# Patient Record
Sex: Female | Born: 1970 | Race: Asian | Hispanic: No | Marital: Married | Smoking: Never smoker
Health system: Southern US, Community
[De-identification: ages and names within clinical notes are randomized; demographics above are authoritative.]

---

## 2008-11-20 ENCOUNTER — Inpatient Hospital Stay (HOSPITAL_COMMUNITY): Admission: AD | Admit: 2008-11-20 | Discharge: 2008-11-20 | Payer: Self-pay | Admitting: Obstetrics & Gynecology

## 2008-11-22 ENCOUNTER — Inpatient Hospital Stay (HOSPITAL_COMMUNITY): Admission: AD | Admit: 2008-11-22 | Discharge: 2008-11-23 | Payer: Self-pay | Admitting: Obstetrics & Gynecology

## 2009-08-14 ENCOUNTER — Inpatient Hospital Stay (HOSPITAL_COMMUNITY): Admission: AD | Admit: 2009-08-14 | Discharge: 2009-08-14 | Payer: Self-pay | Admitting: Obstetrics & Gynecology

## 2009-08-14 ENCOUNTER — Emergency Department (HOSPITAL_BASED_OUTPATIENT_CLINIC_OR_DEPARTMENT_OTHER): Admission: EM | Admit: 2009-08-14 | Discharge: 2009-08-14 | Payer: Self-pay | Admitting: Emergency Medicine

## 2009-08-20 ENCOUNTER — Ambulatory Visit (HOSPITAL_COMMUNITY): Admission: RE | Admit: 2009-08-20 | Discharge: 2009-08-20 | Payer: Self-pay | Admitting: Obstetrics & Gynecology

## 2009-10-09 ENCOUNTER — Inpatient Hospital Stay (HOSPITAL_COMMUNITY): Admission: AD | Admit: 2009-10-09 | Discharge: 2009-10-09 | Payer: Self-pay | Admitting: Obstetrics

## 2009-10-14 ENCOUNTER — Inpatient Hospital Stay (HOSPITAL_COMMUNITY): Admission: AD | Admit: 2009-10-14 | Discharge: 2009-10-16 | Payer: Self-pay | Admitting: Obstetrics & Gynecology

## 2009-10-15 ENCOUNTER — Encounter: Payer: Self-pay | Admitting: Obstetrics & Gynecology

## 2009-11-12 ENCOUNTER — Ambulatory Visit (HOSPITAL_COMMUNITY): Admission: RE | Admit: 2009-11-12 | Discharge: 2009-11-12 | Payer: Self-pay | Admitting: Obstetrics

## 2009-12-02 ENCOUNTER — Ambulatory Visit (HOSPITAL_COMMUNITY): Admission: RE | Admit: 2009-12-02 | Discharge: 2009-12-02 | Payer: Self-pay | Admitting: Obstetrics & Gynecology

## 2010-06-10 ENCOUNTER — Ambulatory Visit (HOSPITAL_COMMUNITY): Admission: RE | Admit: 2010-06-10 | Discharge: 2010-06-10 | Payer: Self-pay | Admitting: Obstetrics and Gynecology

## 2010-06-15 ENCOUNTER — Ambulatory Visit (HOSPITAL_COMMUNITY): Admission: RE | Admit: 2010-06-15 | Discharge: 2010-06-15 | Payer: Self-pay | Admitting: Obstetrics and Gynecology

## 2010-12-05 ENCOUNTER — Encounter: Payer: Self-pay | Admitting: Obstetrics & Gynecology

## 2010-12-05 ENCOUNTER — Encounter: Payer: Self-pay | Admitting: Obstetrics

## 2010-12-06 ENCOUNTER — Encounter: Payer: Self-pay | Admitting: Obstetrics & Gynecology

## 2011-01-18 ENCOUNTER — Other Ambulatory Visit: Payer: Self-pay | Admitting: General Surgery

## 2011-01-18 DIAGNOSIS — N644 Mastodynia: Secondary | ICD-10-CM

## 2011-01-20 ENCOUNTER — Other Ambulatory Visit: Payer: Self-pay

## 2011-01-28 LAB — CBC
HCT: 45.1 % (ref 36.0–46.0)
Hemoglobin: 15.5 g/dL — ABNORMAL HIGH (ref 12.0–15.0)
MCH: 32.5 pg (ref 26.0–34.0)
MCHC: 34.3 g/dL (ref 30.0–36.0)
MCV: 94.8 fL (ref 78.0–100.0)
Platelets: 249 10*3/uL (ref 150–400)
RBC: 4.76 MIL/uL (ref 3.87–5.11)
RDW: 13.2 % (ref 11.5–15.5)
WBC: 6 10*3/uL (ref 4.0–10.5)

## 2011-01-28 LAB — PROTEIN C, TOTAL: Protein C, Total: 95 % (ref 70–140)

## 2011-01-28 LAB — HEPATIC FUNCTION PANEL
ALT: 22 U/L (ref 0–35)
AST: 19 U/L (ref 0–37)
Albumin: 4.2 g/dL (ref 3.5–5.2)
Alkaline Phosphatase: 46 U/L (ref 39–117)
Bilirubin, Direct: 0.2 mg/dL (ref 0.0–0.3)
Indirect Bilirubin: 0.5 mg/dL (ref 0.3–0.9)
Total Bilirubin: 0.7 mg/dL (ref 0.3–1.2)
Total Protein: 7.4 g/dL (ref 6.0–8.3)

## 2011-01-28 LAB — LUPUS ANTICOAGULANT PANEL
DRVVT: 37.9 secs (ref 36.2–44.3)
Lupus Anticoagulant: NOT DETECTED
PTT Lupus Anticoagulant: 36.2 secs (ref 30.0–45.6)

## 2011-01-28 LAB — PROTEIN C ACTIVITY: Protein C Activity: 112 % (ref 75–133)

## 2011-01-28 LAB — BETA-2-GLYCOPROTEIN I ABS, IGG/M/A
Beta-2 Glyco I IgG: 7 G Units (ref <20)
Beta-2-Glycoprotein I IgA: 3 A Units (ref <20)
Beta-2-Glycoprotein I IgM: 1 M Units (ref <20)

## 2011-01-28 LAB — PROTEIN S, TOTAL: Protein S Ag, Total: 90 % (ref 70–140)

## 2011-01-28 LAB — HOMOCYSTEINE: Homocysteine: 5.2 umol/L (ref 4.0–15.4)

## 2011-01-28 LAB — PROTHROMBIN GENE MUTATION

## 2011-01-28 LAB — CARDIOLIPIN ANTIBODIES, IGG, IGM, IGA
Anticardiolipin IgA: 1 APL U/mL — ABNORMAL LOW (ref <22)
Anticardiolipin IgG: 7 GPL U/mL — ABNORMAL LOW (ref <23)
Anticardiolipin IgM: 2 MPL U/mL — ABNORMAL LOW (ref <11)

## 2011-01-28 LAB — PROTEIN S ACTIVITY: Protein S Activity: 58 % — ABNORMAL LOW (ref 69–129)

## 2011-01-28 LAB — FACTOR 5 LEIDEN

## 2011-01-28 LAB — ANTITHROMBIN III: AntiThromb III Func: 112 % (ref 76–126)

## 2011-01-28 IMAGING — US US OB TRANSVAGINAL MODIFY
1 series · 14 of 28 positions shown · non-contrast
Comparison: none

OBSTETRICAL ULTRASOUND:
 This ultrasound exam was performed in the [HOSPITAL] Ultrasound Department.  The OB US report was generated in the AS system, and faxed to the ordering physician.  This report is also available in [HOSPITAL]?s Access[employer] and in [REDACTED] PACS.

[Series 1: us ob comp less 14 wks · 0.22mm/px · 56 acquisitions, 14 frames shown]
[im 3/56]
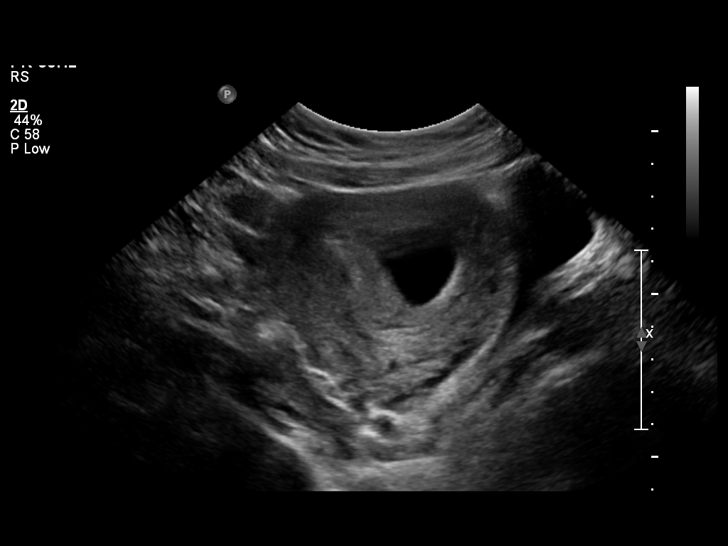
[im 7/56]
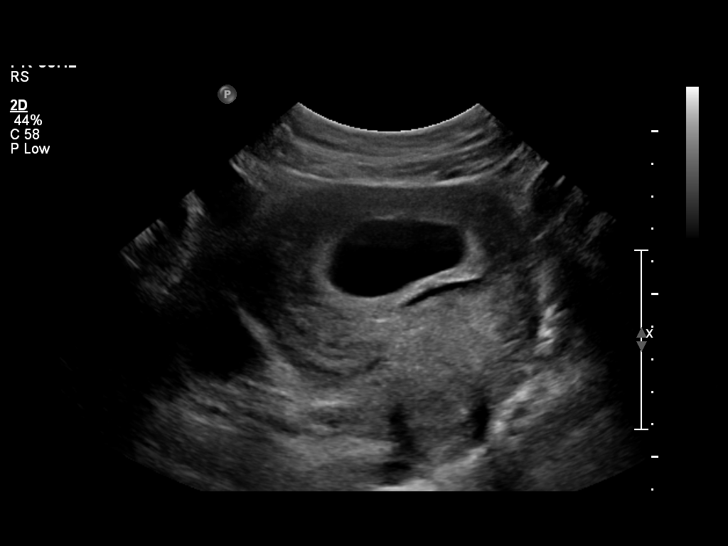
[im 11/56]
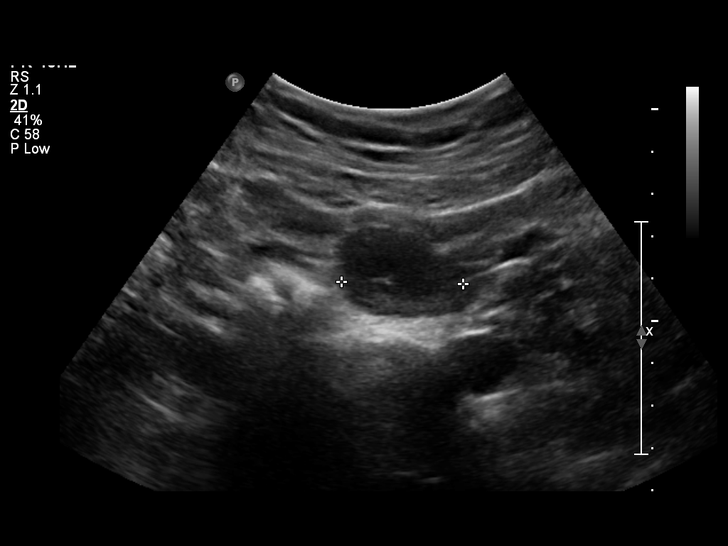
[im 15/56]
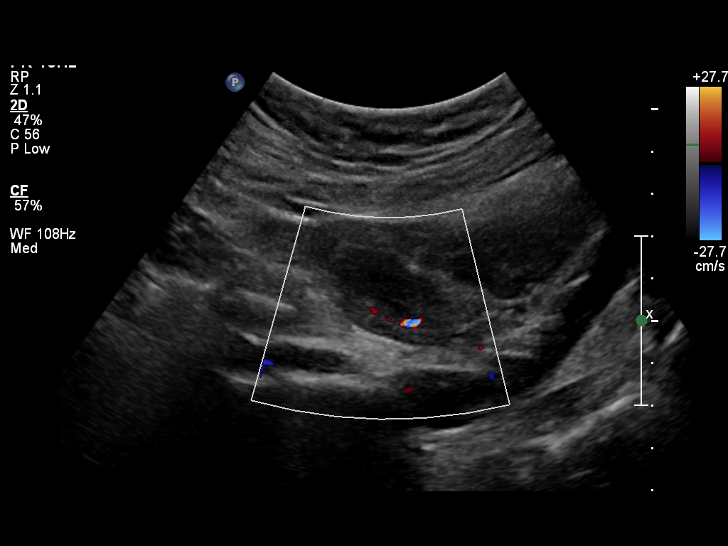
[im 19/56]
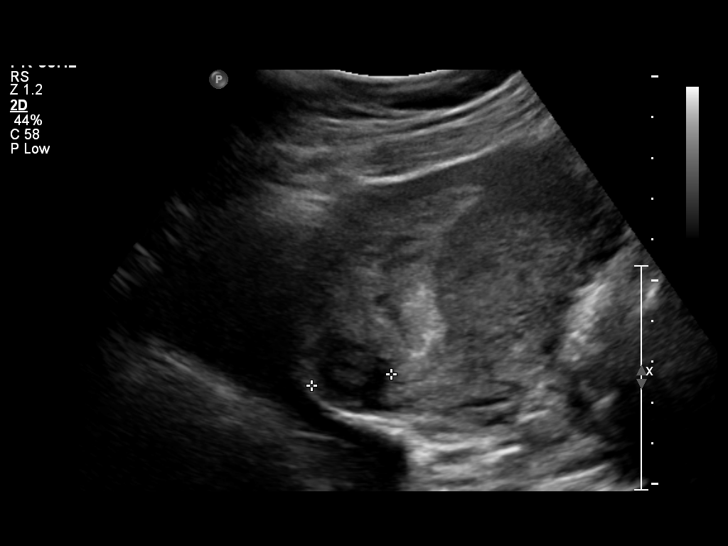
[im 23/56]
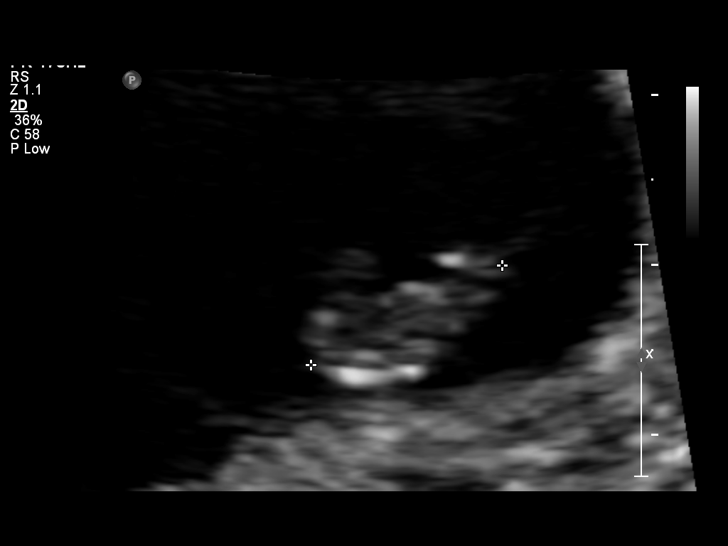
[im 27/56]
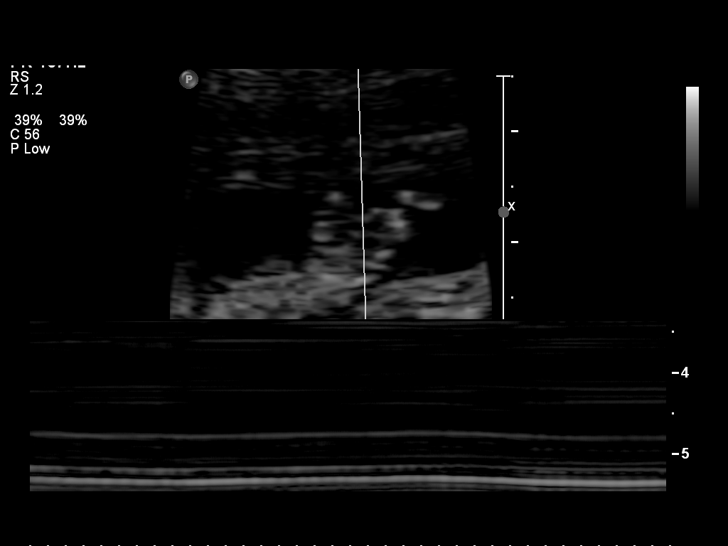
[im 31/56]
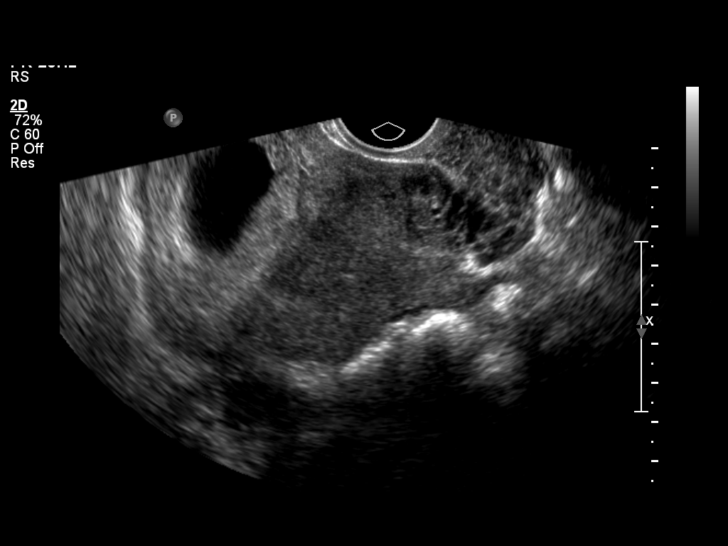
[im 35/56]
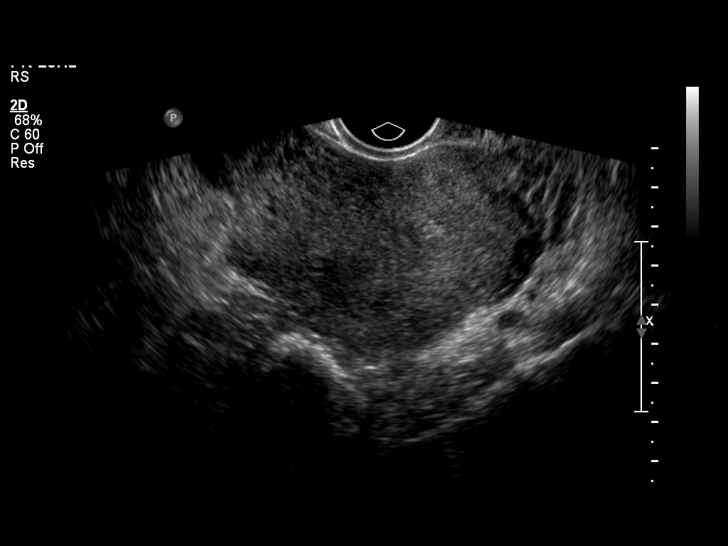
[im 39/56]
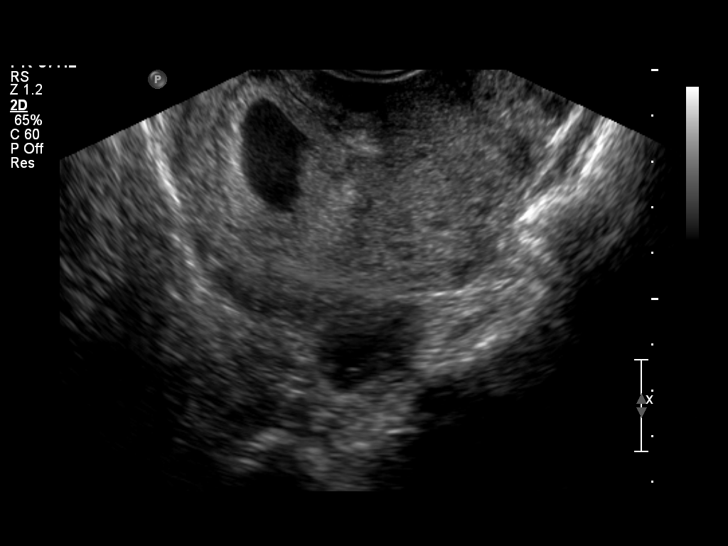
[im 43/56]
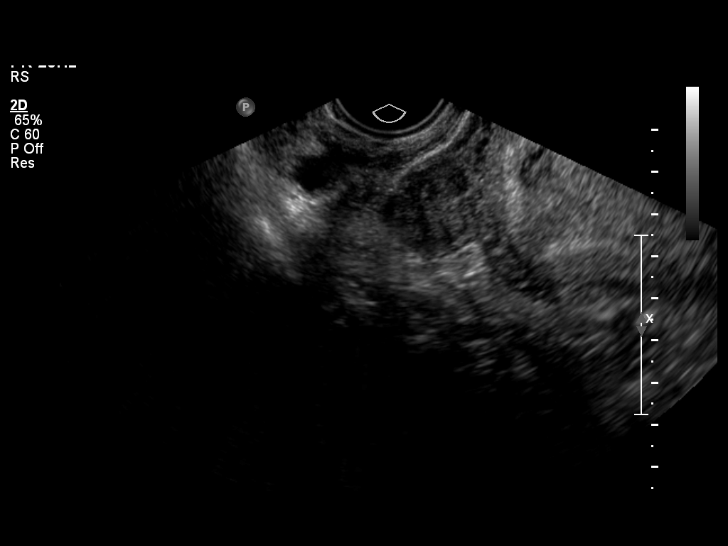
[im 47/56]
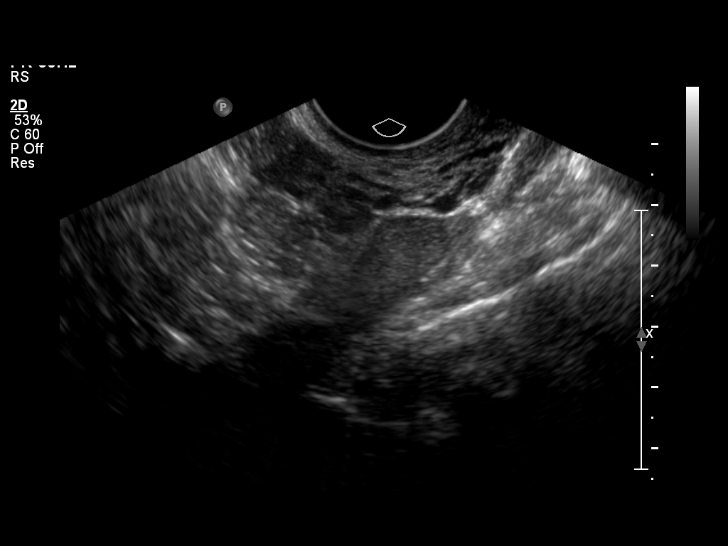
[im 51/56]
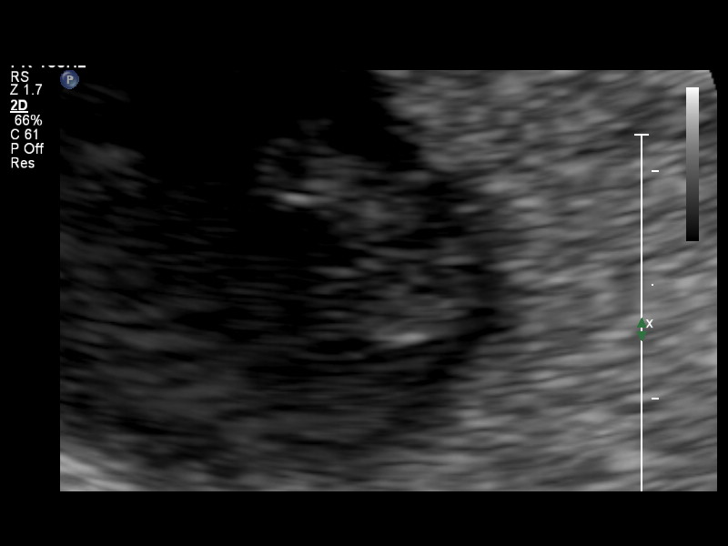
[im 56/56]
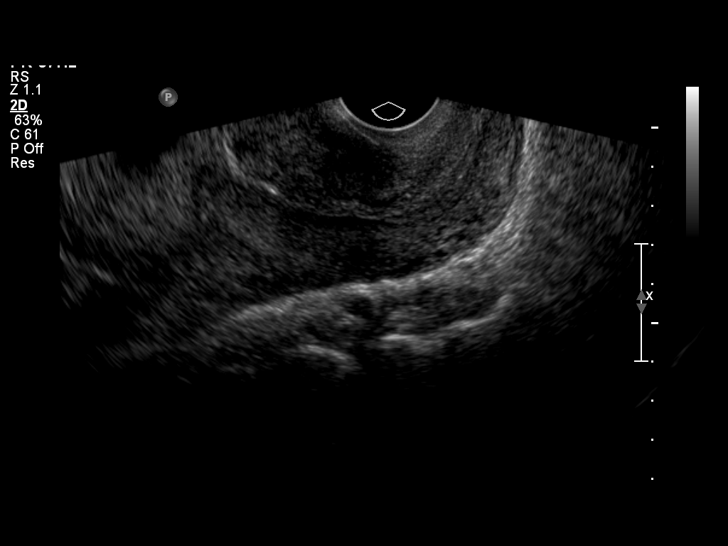

[14 of 28 positions shown; findings below may reference images not displayed]

IMPRESSION: See AS Obstetric US report.

## 2011-02-15 LAB — CBC
HCT: 36.3 % (ref 36.0–46.0)
HCT: 42.9 % (ref 36.0–46.0)
Hemoglobin: 12.5 g/dL (ref 12.0–15.0)
Hemoglobin: 14.6 g/dL (ref 12.0–15.0)
MCHC: 34.1 g/dL (ref 30.0–36.0)
MCHC: 34.5 g/dL (ref 30.0–36.0)
MCV: 98.9 fL (ref 78.0–100.0)
MCV: 99.6 fL (ref 78.0–100.0)
Platelets: 197 10*3/uL (ref 150–400)
Platelets: 221 10*3/uL (ref 150–400)
RBC: 3.67 MIL/uL — ABNORMAL LOW (ref 3.87–5.11)
RBC: 4.31 MIL/uL (ref 3.87–5.11)
RDW: 12.6 % (ref 11.5–15.5)
RDW: 12.8 % (ref 11.5–15.5)
WBC: 11 10*3/uL — ABNORMAL HIGH (ref 4.0–10.5)
WBC: 7.7 10*3/uL (ref 4.0–10.5)

## 2011-02-15 LAB — PARVOVIRUS B19 ANTIBODY, IGG AND IGM
Parovirus B19 IgG Abs: 1.9 {index} — ABNORMAL HIGH
Parovirus B19 IgM Abs: <0.9 {index}

## 2011-02-15 LAB — KLEIHAUER-BETKE STAIN
Fetal Cells %: 0 %
Quantitation Fetal Hemoglobin: 0 mL

## 2011-02-15 LAB — LUPUS ANTICOAGULANT PANEL
DRVVT: 39.2 secs (ref 34.7–40.5)
Lupus Anticoagulant: NOT DETECTED
PTT Lupus Anticoagulant: 35 secs (ref 32.0–43.4)

## 2011-02-15 LAB — CARDIOLIPIN ANTIBODIES, IGM+IGG
Anticardiolipin IgG: <3 GPL U/mL — ABNORMAL LOW
Anticardiolipin IgM: <3 [MPL'U]/mL — ABNORMAL LOW

## 2011-02-15 LAB — RPR: RPR Ser Ql: NONREACTIVE

## 2011-02-15 LAB — TSH: TSH: 0.514 u[IU]/mL (ref 0.350–4.500)

## 2011-02-28 LAB — GC/CHLAMYDIA PROBE AMP, GENITAL
Chlamydia, DNA Probe: NEGATIVE
GC Probe Amp, Genital: NEGATIVE

## 2011-02-28 LAB — URINE CULTURE
Colony Count: NO GROWTH
Culture: NO GROWTH

## 2011-02-28 LAB — URINALYSIS, ROUTINE W REFLEX MICROSCOPIC
Bilirubin Urine: NEGATIVE
Glucose, UA: NEGATIVE mg/dL
Ketones, ur: NEGATIVE mg/dL
Nitrite: NEGATIVE
Protein, ur: NEGATIVE mg/dL
Specific Gravity, Urine: <1.005 — ABNORMAL LOW (ref 1.005–1.030)
Urobilinogen, UA: 0.2 mg/dL (ref 0.0–1.0)
pH: 7 (ref 5.0–8.0)

## 2011-02-28 LAB — HCG, QUANTITATIVE, PREGNANCY: hCG, Beta Chain, Quant, S: 9819 m[IU]/mL — ABNORMAL HIGH (ref <5)

## 2011-02-28 LAB — URINE MICROSCOPIC-ADD ON

## 2011-02-28 LAB — CBC
HCT: 39.7 % (ref 36.0–46.0)
Hemoglobin: 13.4 g/dL (ref 12.0–15.0)
MCHC: 33.7 g/dL (ref 30.0–36.0)
MCV: 95.4 fL (ref 78.0–100.0)
Platelets: 223 10*3/uL (ref 150–400)
RBC: 4.16 MIL/uL (ref 3.87–5.11)
RDW: 12.5 % (ref 11.5–15.5)
WBC: 6 10*3/uL (ref 4.0–10.5)

## 2011-02-28 LAB — WET PREP, GENITAL
Clue Cells Wet Prep HPF POC: NONE SEEN
Trich, Wet Prep: NONE SEEN

## 2011-02-28 LAB — POCT PREGNANCY, URINE: Preg Test, Ur: POSITIVE

## 2011-02-28 LAB — ABO/RH: ABO/RH(D): B POS

## 2012-07-16 ENCOUNTER — Emergency Department (HOSPITAL_COMMUNITY)
Admission: EM | Admit: 2012-07-16 | Discharge: 2012-07-17 | Disposition: A | Discharge status: Home / Self Care | Payer: PRIVATE HEALTH INSURANCE | Attending: Emergency Medicine | Admitting: Emergency Medicine

## 2012-07-16 ENCOUNTER — Emergency Department (HOSPITAL_COMMUNITY): Payer: PRIVATE HEALTH INSURANCE

## 2012-07-16 ENCOUNTER — Encounter (HOSPITAL_COMMUNITY): Payer: Self-pay | Admitting: Family Medicine

## 2012-07-16 DIAGNOSIS — R112 Nausea with vomiting, unspecified: Secondary | ICD-10-CM | POA: Insufficient documentation

## 2012-07-16 DIAGNOSIS — R197 Diarrhea, unspecified: Secondary | ICD-10-CM | POA: Insufficient documentation

## 2012-07-16 DIAGNOSIS — R1031 Right lower quadrant pain: Secondary | ICD-10-CM | POA: Insufficient documentation

## 2012-07-16 DIAGNOSIS — R509 Fever, unspecified: Secondary | ICD-10-CM | POA: Insufficient documentation

## 2012-07-16 DIAGNOSIS — R109 Unspecified abdominal pain: Secondary | ICD-10-CM

## 2012-07-16 DIAGNOSIS — R42 Dizziness and giddiness: Secondary | ICD-10-CM | POA: Insufficient documentation

## 2012-07-16 LAB — CBC WITH DIFFERENTIAL/PLATELET
Basophils Absolute: 0 10*3/uL (ref 0.0–0.1)
Basophils Relative: 0 % (ref 0–1)
Eosinophils Absolute: 0.1 10*3/uL (ref 0.0–0.7)
Eosinophils Relative: 2 % (ref 0–5)
HCT: 40.3 % (ref 36.0–46.0)
Hemoglobin: 14.1 g/dL (ref 12.0–15.0)
Lymphocytes Relative: 23 % (ref 12–46)
Lymphs Abs: 1.7 10*3/uL (ref 0.7–4.0)
MCH: 31.6 pg (ref 26.0–34.0)
MCHC: 35 g/dL (ref 30.0–36.0)
MCV: 90.4 fL (ref 78.0–100.0)
Monocytes Absolute: 0.6 10*3/uL (ref 0.1–1.0)
Monocytes Relative: 9 % (ref 3–12)
Neutro Abs: 4.7 10*3/uL (ref 1.7–7.7)
Neutrophils Relative %: 66 % (ref 43–77)
Platelets: 214 10*3/uL (ref 150–400)
RBC: 4.46 MIL/uL (ref 3.87–5.11)
RDW: 12.3 % (ref 11.5–15.5)
WBC: 7.1 10*3/uL (ref 4.0–10.5)

## 2012-07-16 LAB — URINALYSIS, ROUTINE W REFLEX MICROSCOPIC
Bilirubin Urine: NEGATIVE
Glucose, UA: NEGATIVE mg/dL
Hgb urine dipstick: NEGATIVE
Ketones, ur: NEGATIVE mg/dL
Leukocytes, UA: NEGATIVE
Nitrite: NEGATIVE
Protein, ur: NEGATIVE mg/dL
Specific Gravity, Urine: 1.012 (ref 1.005–1.030)
Urobilinogen, UA: 0.2 mg/dL (ref 0.0–1.0)
pH: 8 (ref 5.0–8.0)

## 2012-07-16 LAB — COMPREHENSIVE METABOLIC PANEL
ALT: 20 U/L (ref 0–35)
AST: 18 U/L (ref 0–37)
Albumin: 3.8 g/dL (ref 3.5–5.2)
Alkaline Phosphatase: 57 U/L (ref 39–117)
BUN: 6 mg/dL (ref 6–23)
CO2: 28 mEq/L (ref 19–32)
Calcium: 9.3 mg/dL (ref 8.4–10.5)
Chloride: 103 mEq/L (ref 96–112)
Creatinine, Ser: 0.63 mg/dL (ref 0.50–1.10)
GFR calc Af Amer: >90 mL/min (ref >90)
GFR calc non Af Amer: >90 mL/min (ref >90)
Glucose, Bld: 114 mg/dL — ABNORMAL HIGH (ref 70–99)
Potassium: 4 mEq/L (ref 3.5–5.1)
Sodium: 139 mEq/L (ref 135–145)
Total Bilirubin: 0.3 mg/dL (ref 0.3–1.2)
Total Protein: 6.8 g/dL (ref 6.0–8.3)

## 2012-07-16 LAB — LIPASE, BLOOD: Lipase: 28 U/L (ref 11–59)

## 2012-07-16 LAB — POCT PREGNANCY, URINE: Preg Test, Ur: NEGATIVE

## 2012-07-16 MED ORDER — ONDANSETRON HCL 4 MG/2ML IJ SOLN
4.0000 mg | Freq: Once | INTRAMUSCULAR | Status: AC
  Administered 2012-07-16: 4 mg via INTRAVENOUS
  Filled 2012-07-16: qty 2

## 2012-07-16 MED ORDER — SODIUM CHLORIDE 0.9 % IV SOLN: 1000.0000 mL | Freq: Once | INTRAVENOUS | Status: DC

## 2012-07-16 MED ORDER — MORPHINE SULFATE 4 MG/ML IJ SOLN
6.0000 mg | Freq: Once | INTRAMUSCULAR | Status: AC
  Administered 2012-07-16: 6 mg via INTRAVENOUS
  Filled 2012-07-16: qty 2

## 2012-07-16 MED ORDER — SODIUM CHLORIDE 0.9 % IV SOLN: 1000.0000 mL | INTRAVENOUS | Status: DC

## 2012-07-16 MED ORDER — SODIUM CHLORIDE 0.9 % IV SOLN
1000.0000 mL | Freq: Once | INTRAVENOUS | Status: AC
  Administered 2012-07-16: 1000 mL via INTRAVENOUS

## 2012-07-16 NOTE — ED Notes (Addendum)
Pt states that when she walks she starts [place] feel nauseas and dizziness. Pt states that she has been feeling this way for 3 days. Pt also c/o pain on her lower right side of her abdomen.

## 2012-07-16 NOTE — ED Notes (Signed)
Pt continues Claudia Beltran c/o pain.  Notified that there is 1 person in front of her Castillo go back. VS stable.

## 2012-07-16 NOTE — ED Notes (Signed)
pt has had right sided abdominal pain, N,V for 3 days. Pt also dizzy .

## 2012-07-16 NOTE — ED Notes (Signed)
Patient transported [facility] 

## 2012-07-17 ENCOUNTER — Emergency Department (HOSPITAL_COMMUNITY): Payer: PRIVATE HEALTH INSURANCE

## 2012-07-17 ENCOUNTER — Encounter (HOSPITAL_COMMUNITY): Payer: Self-pay | Admitting: Radiology

## 2012-07-17 MED ORDER — IOHEXOL 300 MG/ML  SOLN: 20.0000 mL | INTRAMUSCULAR | Status: AC

## 2012-07-17 MED ORDER — OXYCODONE-ACETAMINOPHEN 5-325 MG PO TABS: 1.0000 | ORAL_TABLET | Freq: Four times a day (QID) | ORAL | Status: AC | PRN

## 2012-07-17 MED ORDER — SODIUM CHLORIDE 0.9 % IV SOLN
Freq: Once | INTRAVENOUS | Status: AC
  Administered 2012-07-17: 01:00:00 via INTRAVENOUS

## 2012-07-17 MED ORDER — IOHEXOL 300 MG/ML  SOLN
100.0000 mL | Freq: Once | INTRAMUSCULAR | Status: AC | PRN
  Administered 2012-07-17: 100 mL via INTRAVENOUS

## 2012-07-17 NOTE — ED Notes (Signed)
Used interpreter Spragg explain pt D/C information/ pt placed in waiting area per pt husband will pick her up when he gets off wk at 5:30a.m. Reported [facility] Nurse First

## 2012-07-17 NOTE — ED Provider Notes (Signed)
History     CSN: [id number]  Arrival date & time 07/16/12  1433   First MD Initiated Contact with Patient 07/16/12 2057      Chief Complaint  Patient presents with  . Abdominal Pain    (Consider location/radiation/quality/duration/timing/severity/associated sxs/prior treatment) The history is provided by the patient.   the patient reports right-sided abdominal pain and nausea vomiting for 3 days.  Patient is also had some lightheadedness.  She denies melena or hematochezia.  She's had no hematemesis.  She reports the pain is worsened when she is.  The pain has been somewhat intermittent although seems Achenbach be worsening at this time.  She points Guo the right side of her abdomen when describing the pain.  She has no flank pain.  She denies dysuria or urinary frequency.  She's had no fevers or chills.  She's never had this type of abdominal pain before.  Pain is mild Dexheimer moderate in severity.  History reviewed. No pertinent past medical history.  History reviewed. No pertinent past surgical history.  History reviewed. No pertinent family history.  History  Substance Use Topics  . Smoking status: Never Smoker   . Smokeless tobacco: Not on file  . Alcohol Use: No    OB History    Grav Para Term Preterm Abortions TAB SAB Ect Mult Living                  Review of Systems  All other systems reviewed and are negative.    Allergies  Review of patient's allergies indicates no known allergies.  Home Medications   Current Outpatient Rx  Name Route Sig Dispense Refill  . ADULT MULTIVITAMIN W/MINERALS CH Oral Take 1 tablet by mouth daily.    Marland Kitchen OVER THE COUNTER MEDICATION Oral Take 1 tablet by mouth 2 (two) times daily as needed. Over the counter medication taken twice a day as needed for pain      BP 118/72  Pulse 60  Temp 97.9 F (36.6 C) (Oral)  Resp 17  SpO2 99%  LMP 07/10/2012  Physical Exam  Nursing note and vitals reviewed. Constitutional: She is oriented [place] person,  place, and time. She appears well-developed and well-nourished. No distress.  HENT:  Head: Normocephalic and atraumatic.  Eyes: EOM are normal.  Neck: Normal range of motion.  Cardiovascular: Normal rate, regular rhythm and normal heart sounds.   Pulmonary/Chest: Effort normal and breath sounds normal.  Abdominal: Soft. She exhibits no distension.       Mild right-sided abdominal tenderness without guarding or rebound  Genitourinary:       No CVA tenderness  Musculoskeletal: Normal range of motion.  Neurological: She is alert and oriented [place] person, place, and time.  Skin: Skin is warm and dry.  Psychiatric: She has a normal mood and affect. Judgment normal.    ED Course  Procedures (including critical care time)  Labs Reviewed  COMPREHENSIVE METABOLIC PANEL - Abnormal; Notable for the following:    Glucose, Bld 114 (*)     All other components within normal limits  URINALYSIS, ROUTINE W REFLEX MICROSCOPIC - Abnormal; Notable for the following:    APPearance CLOUDY (*)     All other components within normal limits  CBC WITH DIFFERENTIAL  POCT PREGNANCY, URINE  LIPASE, BLOOD   US Abdomen Complete  07/16/2012  *RADIOLOGY REPORT*  Clinical Data:  Abdominal pain, nausea, fever, diarrhea  ULTRASOUND ABDOMEN:  Technique:  Sonography of upper abdominal structures was performed.  Comparison:  None  Gallbladder:  Normally distended without stones or wall thickening. No pericholecystic fluid or sonographic Murphy sign.  Common bile duct:  Normal caliber 4 mm diameter  Liver:  Normal appearance  IVC:  Normal appearance  Pancreas:  Upper normal pancreatic duct size 2.9 mm.  Otherwise normal appearance.  Spleen:  Normal appearance, 5.6 cm length  Right kidney:  9.6 cm length.  No mass or hydronephrosis.  Left kidney:  9.3 cm length. Normal morphology without mass or hydronephrosis.  Aorta:  Normal caliber  Other:  No free fluid  IMPRESSION: No acute abnormalities.   Original Report Authenticated  By: Lollie Marrow, M.D.     I personally reviewed the imaging tests through PACS system  I reviewed available ER/hospitalization records thought the EMR   No diagnosis found.    MDM  The patient had symptoms are concerning for cholelithiasis per ultrasound demonstrates no evidence of gallstones.  On repeat abdominal exam the patient is continuing Geraghty have right-sided abdominal tenderness I do not have a definitive cause for this given her ongoing pain I think important that we evaluate her she does not have a primary care physician.  She will have a CT abdomen pelvis performed Baiz evaluate further for the cause of her right-sided abdominal pain.        Lyanne Co, MD 07/17/12 [phone]

## 2012-07-17 NOTE — ED Notes (Signed)
Pt is back in room from radiology 

## 2012-07-17 NOTE — ED Notes (Signed)
Patient transported [place] [facility] 

## 2012-11-16 ENCOUNTER — Emergency Department (HOSPITAL_BASED_OUTPATIENT_CLINIC_OR_DEPARTMENT_OTHER): Payer: PRIVATE HEALTH INSURANCE

## 2012-11-16 ENCOUNTER — Emergency Department (HOSPITAL_BASED_OUTPATIENT_CLINIC_OR_DEPARTMENT_OTHER)
Admission: EM | Admit: 2012-11-16 | Discharge: 2012-11-16 | Disposition: A | Discharge status: Home / Self Care | Payer: PRIVATE HEALTH INSURANCE | Attending: Emergency Medicine | Admitting: Emergency Medicine

## 2012-11-16 ENCOUNTER — Other Ambulatory Visit: Payer: Self-pay | Admitting: Obstetrics and Gynecology

## 2012-11-16 ENCOUNTER — Encounter (HOSPITAL_BASED_OUTPATIENT_CLINIC_OR_DEPARTMENT_OTHER): Payer: Self-pay | Admitting: *Deleted

## 2012-11-16 DIAGNOSIS — R059 Cough, unspecified: Secondary | ICD-10-CM

## 2012-11-16 DIAGNOSIS — Z79899 Other long term (current) drug therapy: Secondary | ICD-10-CM | POA: Insufficient documentation

## 2012-11-16 DIAGNOSIS — R05 Cough: Secondary | ICD-10-CM

## 2012-11-16 DIAGNOSIS — N644 Mastodynia: Secondary | ICD-10-CM

## 2012-11-16 DIAGNOSIS — R0609 Other forms of dyspnea: Secondary | ICD-10-CM | POA: Insufficient documentation

## 2012-11-16 DIAGNOSIS — R0600 Dyspnea, unspecified: Secondary | ICD-10-CM

## 2012-11-16 DIAGNOSIS — R0989 Other specified symptoms and signs involving the circulatory and respiratory systems: Secondary | ICD-10-CM | POA: Insufficient documentation

## 2012-11-16 NOTE — ED Provider Notes (Addendum)
History     CSN: [id number]  Arrival date & time 11/16/12  0716   First MD Initiated Contact with Patient 11/16/12 [phone]      Chief Complaint  Patient presents with  . Cough    (Consider location/radiation/quality/duration/timing/severity/associated sxs/prior treatment) HPI Comments: Patient presents after an episode of difficulty breathing, "gasping for air" that started shortly after arriving at work.  She was fine last night and in the morning.  She then went into the bathroom and coughed up a little bit of blood.  She denies chest pain or shortness of breath.  There is no fever.  She feels fine now.  She does not speak much [place].  History was taken through boyfriend who acts as Nurse, learning disability and co-worker who was present at bedside who witnessed the episode.  He says this looked like a panic attack as his girlfriend has these and has seen them before.  Patient is a 42 y.o. female presenting with cough. The history is provided by the patient.  Cough This is a new problem. The current episode started less than 1 hour ago. The problem occurs constantly. The problem has been resolved. The cough is productive of bloody sputum. There has been no fever. Pertinent negatives include no chest pain. She has tried nothing for the symptoms.    History reviewed. No pertinent past medical history.  History reviewed. No pertinent past surgical history.  History reviewed. No pertinent family history.  History  Substance Use Topics  . Smoking status: Never Smoker   . Smokeless tobacco: Not on file  . Alcohol Use: No    OB History    Grav Para Term Preterm Abortions TAB SAB Ect Mult Living                  Review of Systems  Respiratory: Positive for cough.   Cardiovascular: Negative for chest pain.  All other systems reviewed and are negative.    Allergies  Review of patient's allergies indicates no known allergies.  Home Medications   Current Outpatient Rx  Name  Route  Sig   Dispense  Refill  . ADULT MULTIVITAMIN W/MINERALS CH   Oral   Take 1 tablet by mouth daily.         Marland Kitchen OVER THE COUNTER MEDICATION   Oral   Take 1 tablet by mouth 2 (two) times daily as needed. Over the counter medication taken twice a day as needed for pain           BP 118/90  Pulse 71  Temp 98.4 F (36.9 C) (Oral)  Resp 18  SpO2 99%  LMP 11/08/2012  Physical Exam  Nursing note and vitals reviewed. Constitutional: She is oriented [place] person, place, and time. She appears well-developed and well-nourished. No distress.  HENT:  Head: Normocephalic and atraumatic.  Mouth/Throat: Oropharynx is clear and moist. No oropharyngeal exudate.       TM's clear bilaterally.  Neck: Normal range of motion. Neck supple.  Cardiovascular: Normal rate and regular rhythm.  Exam reveals no gallop and no friction rub.   No murmur heard. Pulmonary/Chest: Effort normal and breath sounds normal. No respiratory distress. She has no wheezes. She has no rales. She exhibits no tenderness.  Abdominal: Soft. Bowel sounds are normal. She exhibits no distension. There is no tenderness.  Musculoskeletal: Normal range of motion. She exhibits no edema.  Neurological: She is alert and oriented [place] person, place, and time.  Skin: Skin is warm and dry. She  is not diaphoretic.    ED Course  Procedures (including critical care time)  Labs Reviewed - No data Sharron display No results found.   No diagnosis found.    MDM  The patient presents here after a coughing/dyspneic episode that caused her [place] "gasp for air."  She appears fine now.  She is comfortable, breathing normally, and is no distress.  There is no stridor, lungs are clear, RR is 18, and O2 sats are 99%.  The chest xray does not reveal any cardiopulmonary abnormality.  A witness believes she had a panic attack.  This is a possibility.  The presentation and workup are inconsistent with PE and I do not feel as though further workup into this is  indicated.  She has an appointment today [place] evaluate a ct abnormality in her pancreas and I have encouraged her Fanfan keep this appointment.  I am also reluctant H[place]on perform further workup as this may compromise testing that is [place] be performed for her other issues.  At this point, she appears very stable for discharge.        Geoffery Lyons, MD 11/16/12 1610  Geoffery Lyons, MD 11/16/12 [phone]

## 2012-11-16 NOTE — ED Notes (Signed)
Pt here from ems from work stating she had a coughing spell and coughed a little blood

## 2012-11-30 ENCOUNTER — Ambulatory Visit
Admission: RE | Admit: 2012-11-30 | Discharge: 2012-11-30 | Disposition: A | Discharge status: Home / Self Care | Payer: PRIVATE HEALTH INSURANCE | Source: Ambulatory Visit | Attending: Obstetrics and Gynecology | Admitting: Obstetrics and Gynecology

## 2012-11-30 DIAGNOSIS — N644 Mastodynia: Secondary | ICD-10-CM

## 2013-03-05 IMAGING — US US ABDOMEN COMPLETE
1 series · 14 of 25 positions shown · non-contrast
Comparison: None

CLINICAL DATA: Abdominal pain, nausea, fever, diarrhea

ULTRASOUND ABDOMEN:
TECHNIQUE: Sonography of upper abdominal structures was performed.

[Series 1: us abdomen complete · 0.21mm/px · 14 of 122 slices shown]
[im 1/122]
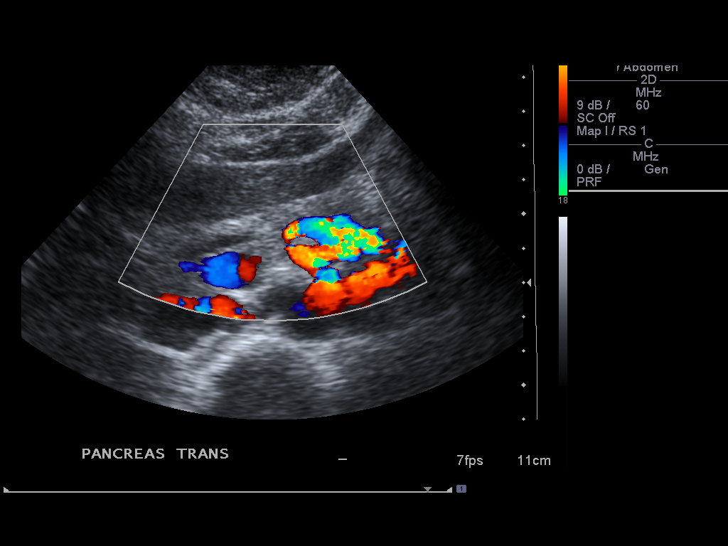
[im 11/122]
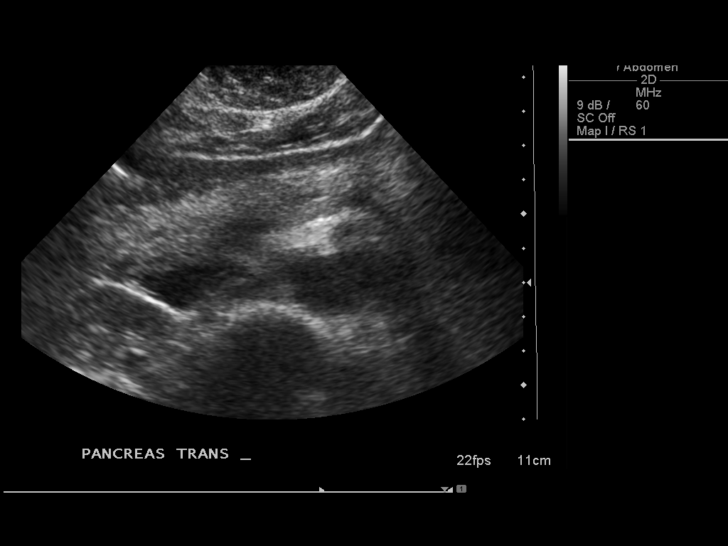
[im 21/122]
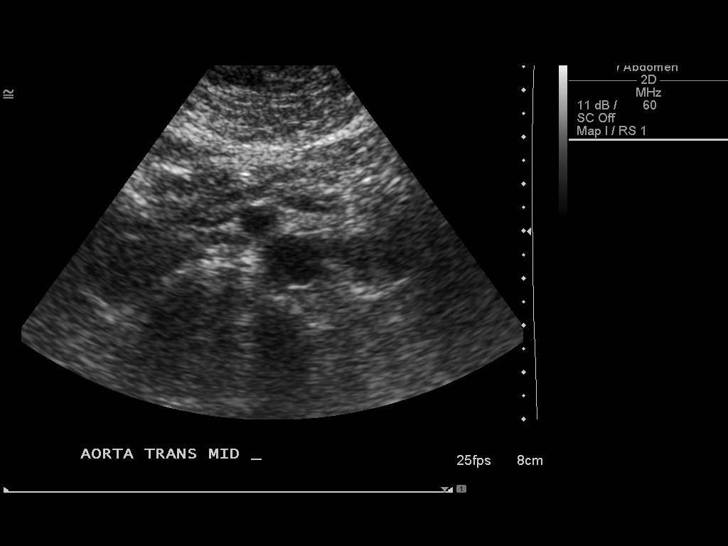
[im 31/122]
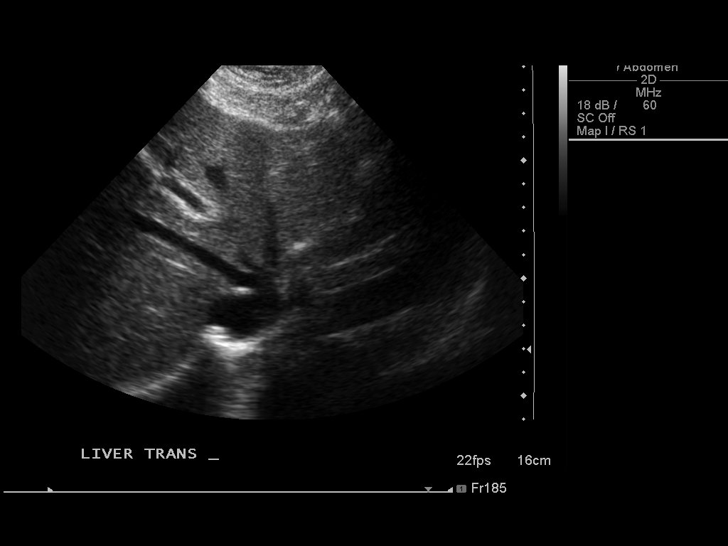
[im 41/122]
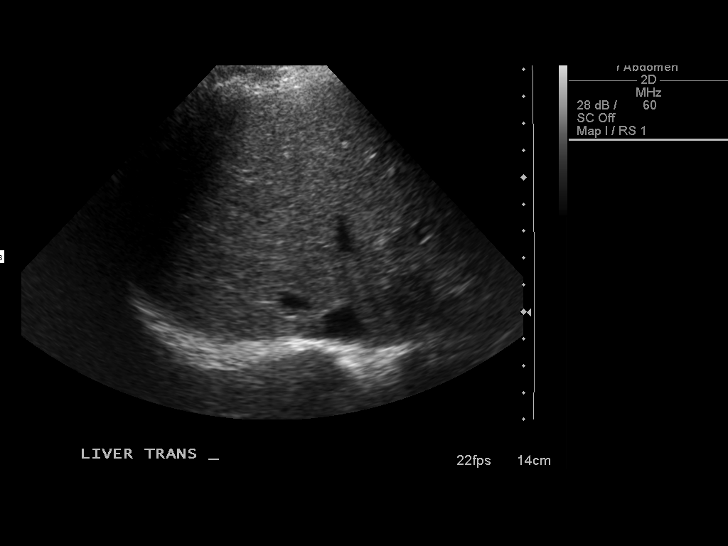
[im 46/122]
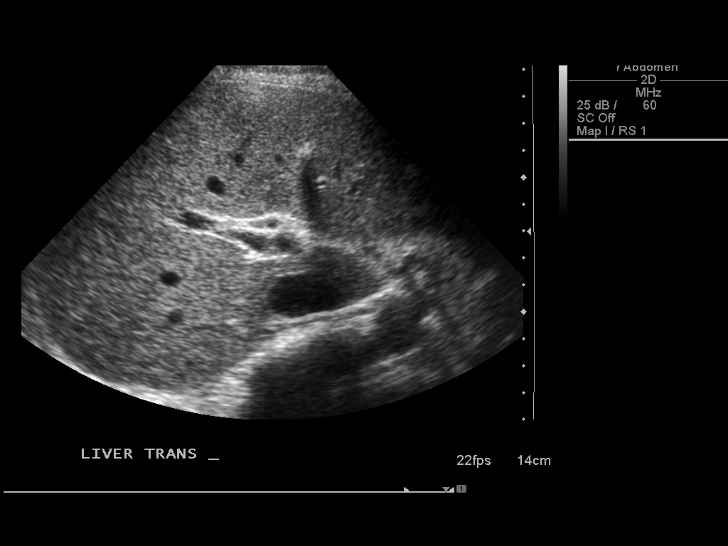
[im 56/122]
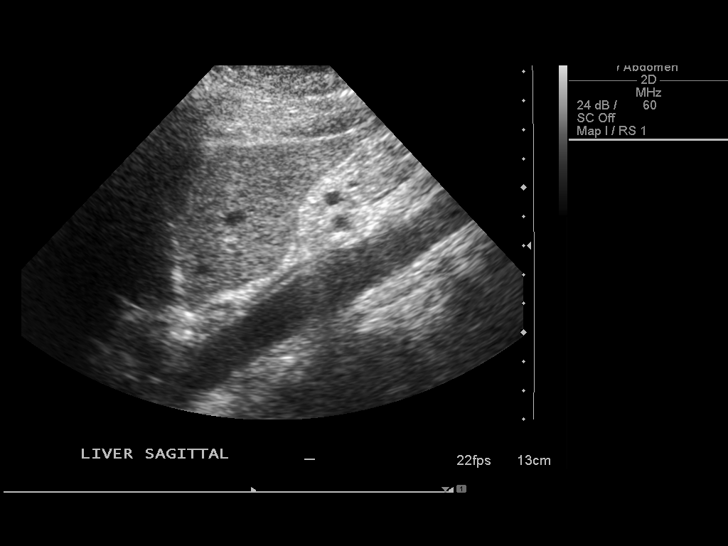
[im 66/122]
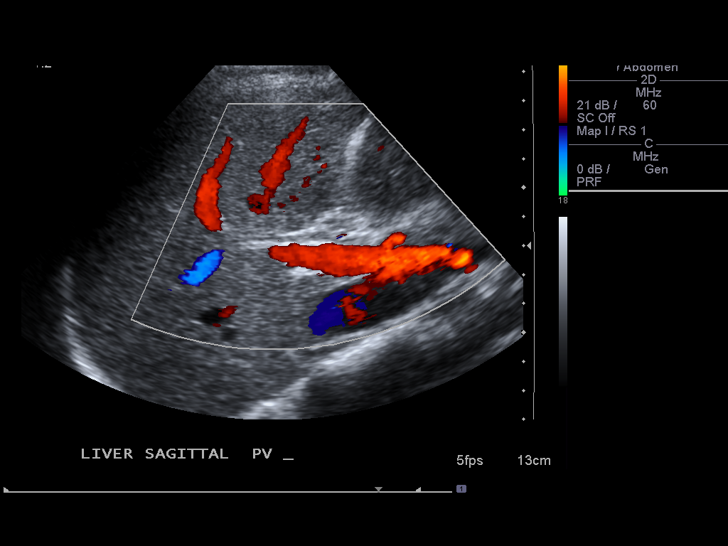
[im 76/122]
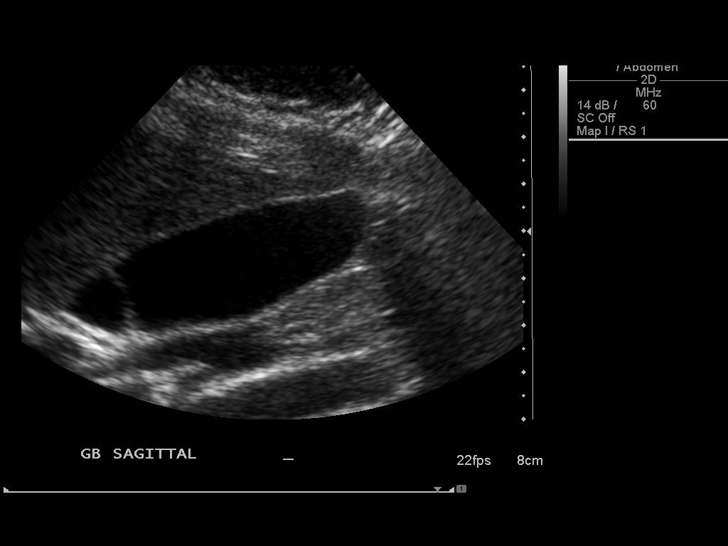
[im 81/122]
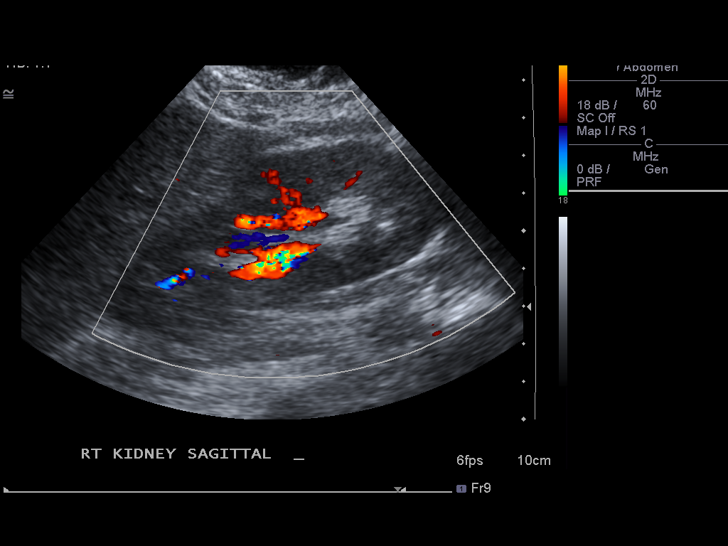
[im 91/122]
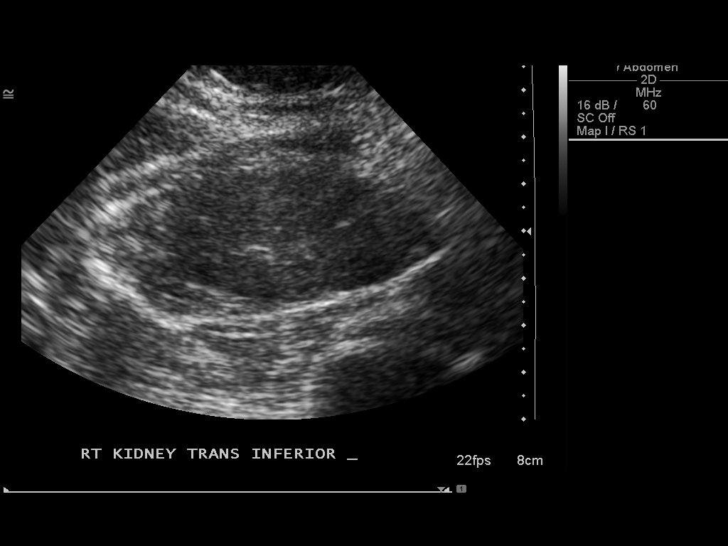
[im 101/122]
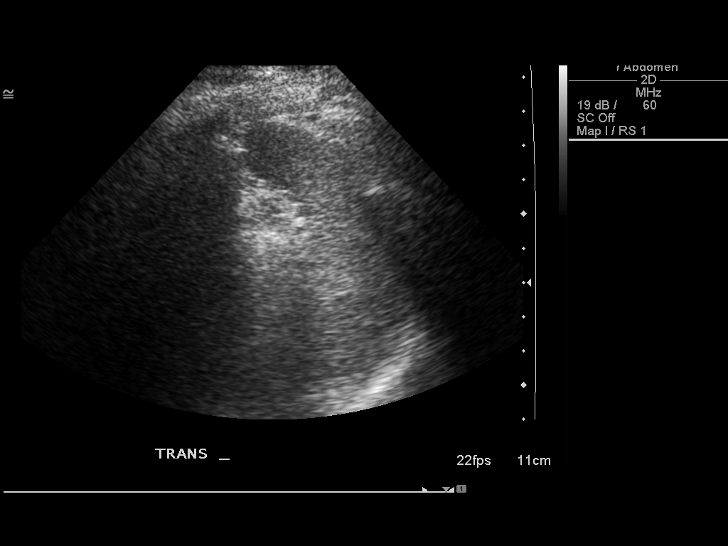
[im 111/122]
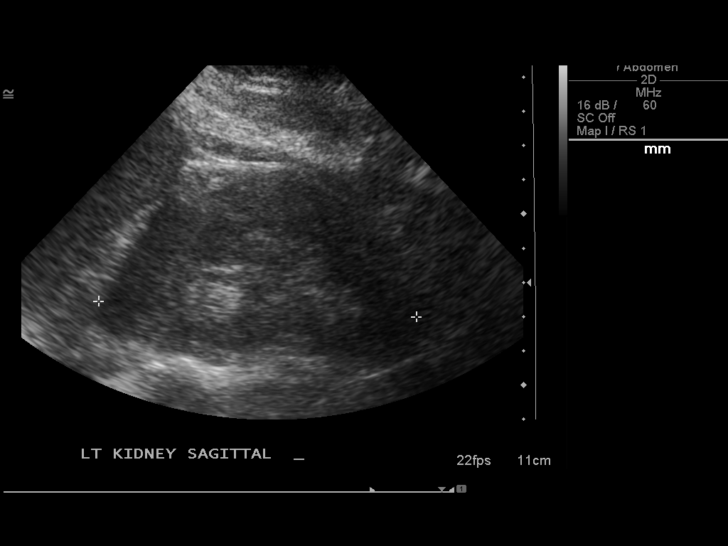
[im 122/122]
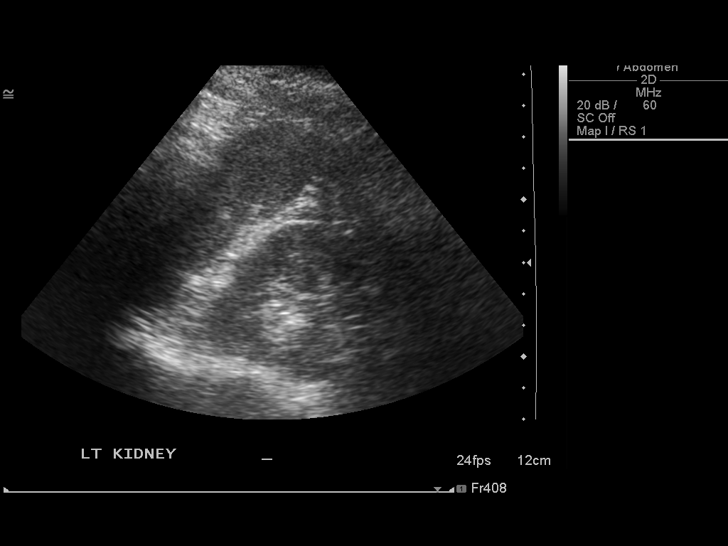

[14 of 25 positions shown; findings below may reference images not displayed]

Gallbladder:  Normally distended without stones or wall thickening.
No pericholecystic fluid or sonographic Murphy sign.

Common bile duct:  Normal caliber 4 mm diameter

Liver:  Normal appearance

IVC:  Normal appearance

Pancreas:  Upper normal pancreatic duct size 2.9 mm.  Otherwise
normal appearance.

Spleen:  Normal appearance, 5.6 cm length

Right kidney:  9.6 cm length.  No mass or hydronephrosis.

Left kidney:  9.3 cm length. Normal morphology without mass or
hydronephrosis.

Aorta:  Normal caliber

Other:  No free fluid
IMPRESSION: No acute abnormalities.

## 2013-10-30 ENCOUNTER — Other Ambulatory Visit: Payer: Self-pay

## 2013-10-30 DIAGNOSIS — Z1231 Encounter for screening mammogram for malignant neoplasm of breast: Secondary | ICD-10-CM

## 2013-12-02 ENCOUNTER — Ambulatory Visit: Payer: PRIVATE HEALTH INSURANCE

## 2013-12-10 ENCOUNTER — Ambulatory Visit: Payer: PRIVATE HEALTH INSURANCE

## 2013-12-30 ENCOUNTER — Ambulatory Visit
Admission: RE | Admit: 2013-12-30 | Discharge: 2013-12-30 | Disposition: A | Discharge status: Home / Self Care | Payer: PRIVATE HEALTH INSURANCE | Source: Ambulatory Visit

## 2013-12-30 DIAGNOSIS — Z1231 Encounter for screening mammogram for malignant neoplasm of breast: Secondary | ICD-10-CM

## 2018-08-07 ENCOUNTER — Other Ambulatory Visit: Payer: Self-pay | Admitting: Internal Medicine

## 2018-08-07 DIAGNOSIS — R1011 Right upper quadrant pain: Secondary | ICD-10-CM

## 2018-08-10 ENCOUNTER — Ambulatory Visit
Admission: RE | Admit: 2018-08-10 | Discharge: 2018-08-10 | Disposition: A | Discharge status: Home / Self Care | Payer: PRIVATE HEALTH INSURANCE | Source: Ambulatory Visit | Attending: Internal Medicine | Admitting: Internal Medicine

## 2018-08-10 DIAGNOSIS — R1011 Right upper quadrant pain: Secondary | ICD-10-CM

## 2020-01-23 ENCOUNTER — Ambulatory Visit: Payer: Self-pay | Attending: Internal Medicine

## 2020-01-23 DIAGNOSIS — Z23 Encounter for immunization: Secondary | ICD-10-CM

## 2020-01-23 NOTE — Progress Notes (Signed)
   Covid-19 Vaccination Clinic  Name:  Claudia Beltran    MRN: [id number] DOB: [birth year]  01/23/2020  Claudia Beltran was observed post Covid-19 immunization for 15 minutes without incident. Claudia Beltran was provided with Vaccine Information Sheet and instruction [place] access the V-Safe system.   Claudia Beltran was instructed Claudia Beltran call 911 with any severe reactions post vaccine: Marland Kitchen Difficulty breathing  . Swelling of face and throat  . A fast heartbeat  . A bad rash all over body  . Dizziness and weakness   Immunizations Administered    Name Date Dose VIS Date Route   Pfizer COVID-19 Vaccine 01/23/2020  8:43 AM 0.3 mL 10/25/2019 Intramuscular   Manufacturer: [employer], [employer]   Lot: [id number]   NDC: [address]-7

## 2020-02-17 ENCOUNTER — Ambulatory Visit: Payer: Self-pay | Attending: Internal Medicine

## 2020-02-17 DIAGNOSIS — Z23 Encounter for immunization: Secondary | ICD-10-CM

## 2020-02-17 NOTE — Progress Notes (Signed)
   Covid-19 Vaccination Clinic  Name:  Claudia Beltran    MRN: [id number] DOB: 1971-03-12  02/17/2020  Claudia Beltran was observed post Covid-19 immunization for 15 minutes without incident. She was provided with Vaccine Information Sheet and instruction [place] access the V-Safe system.   Claudia Beltran was instructed [place] call 911 with any severe reactions post vaccine: Marland Kitchen Difficulty breathing  . Swelling of face and throat  . A fast heartbeat  . A bad rash all over body  . Dizziness and weakness   Immunizations Administered    Name Date Dose VIS Date Route   Pfizer COVID-19 Vaccine 02/17/2020  3:00 PM 0.3 mL 10/25/2019 Intramuscular   Manufacturer: [employer], [employer]   Lot: [id number]   NDC: [address]-9

## 2020-11-11 DIAGNOSIS — Z20822 Contact with and (suspected) exposure to covid-19: Secondary | ICD-10-CM | POA: Diagnosis not present

## 2020-11-24 DIAGNOSIS — Z1231 Encounter for screening mammogram for malignant neoplasm of breast: Secondary | ICD-10-CM | POA: Diagnosis not present

## 2020-12-21 DIAGNOSIS — E559 Vitamin D deficiency, unspecified: Secondary | ICD-10-CM | POA: Diagnosis not present

## 2020-12-21 DIAGNOSIS — E7800 Pure hypercholesterolemia, unspecified: Secondary | ICD-10-CM | POA: Diagnosis not present

## 2020-12-21 DIAGNOSIS — Z0000 Encounter for general adult medical examination without abnormal findings: Secondary | ICD-10-CM | POA: Diagnosis not present

## 2021-01-29 DIAGNOSIS — Z01419 Encounter for gynecological examination (general) (routine) without abnormal findings: Secondary | ICD-10-CM | POA: Diagnosis not present

## 2021-09-07 DIAGNOSIS — R109 Unspecified abdominal pain: Secondary | ICD-10-CM | POA: Diagnosis not present

## 2021-09-07 DIAGNOSIS — Z20822 Contact with and (suspected) exposure to covid-19: Secondary | ICD-10-CM | POA: Diagnosis not present

## 2021-09-07 DIAGNOSIS — R112 Nausea with vomiting, unspecified: Secondary | ICD-10-CM | POA: Diagnosis not present

## 2021-09-07 DIAGNOSIS — R1110 Vomiting, unspecified: Secondary | ICD-10-CM | POA: Diagnosis not present

## 2024-03-08 ENCOUNTER — Other Ambulatory Visit: Payer: Self-pay

## 2024-03-11 LAB — SURGICAL PATHOLOGY
